# Patient Record
Sex: Male | Born: 1950 | Race: White | Hispanic: No | Marital: Single | Smoking: Never smoker
Health system: Southern US, Community
[De-identification: ages and names within clinical notes are randomized; demographics above are authoritative.]

## PROBLEM LIST (undated history)

## (undated) DIAGNOSIS — N138 Other obstructive and reflux uropathy: Secondary | ICD-10-CM

## (undated) DIAGNOSIS — N401 Enlarged prostate with lower urinary tract symptoms: Secondary | ICD-10-CM

## (undated) DIAGNOSIS — J309 Allergic rhinitis, unspecified: Secondary | ICD-10-CM

## (undated) HISTORY — DX: Benign prostatic hyperplasia with lower urinary tract symptoms: N40.1

## (undated) HISTORY — DX: Allergic rhinitis, unspecified: J30.9

## (undated) HISTORY — PX: COLONOSCOPY: SHX174

## (undated) HISTORY — DX: Other obstructive and reflux uropathy: N13.8

---

## 2013-09-15 ENCOUNTER — Encounter (INDEPENDENT_AMBULATORY_CARE_PROVIDER_SITE_OTHER): Payer: Self-pay | Admitting: Surgery

## 2013-09-24 ENCOUNTER — Ambulatory Visit (INDEPENDENT_AMBULATORY_CARE_PROVIDER_SITE_OTHER): Payer: BC Managed Care – PPO | Admitting: Surgery

## 2016-07-24 ENCOUNTER — Emergency Department (HOSPITAL_COMMUNITY)
Admission: EM | Admit: 2016-07-24 | Discharge: 2016-07-24 | Disposition: A | Discharge status: Home / Self Care | Payer: Medicare PPO | Attending: Emergency Medicine | Admitting: Emergency Medicine

## 2016-07-24 ENCOUNTER — Emergency Department (HOSPITAL_COMMUNITY): Payer: Medicare PPO

## 2016-07-24 ENCOUNTER — Encounter (HOSPITAL_COMMUNITY): Payer: Self-pay | Admitting: *Deleted

## 2016-07-24 DIAGNOSIS — S61325A Laceration with foreign body of left ring finger with damage to nail, initial encounter: Secondary | ICD-10-CM | POA: Diagnosis not present

## 2016-07-24 DIAGNOSIS — W268XXA Contact with other sharp object(s), not elsewhere classified, initial encounter: Secondary | ICD-10-CM | POA: Insufficient documentation

## 2016-07-24 DIAGNOSIS — S65515A Laceration of blood vessel of left ring finger, initial encounter: Secondary | ICD-10-CM | POA: Insufficient documentation

## 2016-07-24 DIAGNOSIS — Z23 Encounter for immunization: Secondary | ICD-10-CM | POA: Insufficient documentation

## 2016-07-24 DIAGNOSIS — Y929 Unspecified place or not applicable: Secondary | ICD-10-CM | POA: Insufficient documentation

## 2016-07-24 DIAGNOSIS — Y999 Unspecified external cause status: Secondary | ICD-10-CM | POA: Insufficient documentation

## 2016-07-24 DIAGNOSIS — S6992XA Unspecified injury of left wrist, hand and finger(s), initial encounter: Secondary | ICD-10-CM | POA: Diagnosis present

## 2016-07-24 DIAGNOSIS — Y9389 Activity, other specified: Secondary | ICD-10-CM | POA: Diagnosis not present

## 2016-07-24 DIAGNOSIS — Z79899 Other long term (current) drug therapy: Secondary | ICD-10-CM | POA: Diagnosis not present

## 2016-07-24 DIAGNOSIS — S65595A Other specified injury of blood vessel of left ring finger, initial encounter: Secondary | ICD-10-CM | POA: Diagnosis not present

## 2016-07-24 MED ORDER — LIDOCAINE HCL (PF) 1 % IJ SOLN
5.0000 mL | Freq: Once | INTRAMUSCULAR | Status: AC
  Administered 2016-07-24: 5 mL
  Filled 2016-07-24: qty 5

## 2016-07-24 MED ORDER — TETANUS-DIPHTH-ACELL PERTUSSIS 5-2.5-18.5 LF-MCG/0.5 IM SUSP
0.5000 mL | Freq: Once | INTRAMUSCULAR | Status: AC
  Administered 2016-07-24: 0.5 mL via INTRAMUSCULAR
  Filled 2016-07-24: qty 0.5

## 2016-07-24 NOTE — ED Triage Notes (Signed)
Pt reports cutting left ring finger with hedge clipper and has laceration to finger tip. Bleeding controlled and tetanus > 10years.

## 2016-07-24 NOTE — ED Provider Notes (Signed)
[facility]EM[facility]Y DEPT Provider Note    By signing my name below, I, Earmon PhoenixJennifer Waddell, attest that this documentation has been prepared under the direction and in the presence of [facility] Neese, [place]. Electronically Signed: Earmon PhoenixJennifer Waddell, ED Scribe. 07/24/16. 7:54 PM.   History   Chief Complaint Chief Complaint  Patient presents with  . Finger Injury    The history is provided by the patient. No language interpreter was used.    HPI Comments:  Manuel Oconnor is a 65 y.o. male who presents to the Emergency Department complaining of a laceration to the left fourth digit secondary to cutting it with hedge clippers earlier today. He reports associated bleeding that has since been controlled. [facility] in [place] and was told [facility]. He has not taken anything for pain. He denies modifying factors. He denies numbness, tingling or weakness of the left ring finger or left hand. Pt states his tetanus vaccination was about 35 years ago.  Past Medical History:  Diagnosis Date  . Allergic rhinitis, cause unspecified   . Hypertrophy of prostate with urinary obstruction and other lower urinary tract symptoms (LUTS)     There are no active problems to display for this patient.   Past Surgical History:  Procedure Laterality Date  . COLONOSCOPY         Home Medications    Prior to Admission medications   Medication Sig Start Date End Date Taking? Authorizing Provider  azithromycin (ZITHROMAX) 250 MG tablet Take by mouth daily.    Historical Provider, MD  cetirizine (ZYRTEC) 10 MG tablet Take 10 mg by mouth daily.    Historical Provider, MD  ibuprofen (ADVIL,MOTRIN) 200 MG tablet Take 200 mg by mouth every 6 (six) hours as needed.    Historical Provider, MD  meloxicam (MOBIC) 7.5 MG tablet Take 7.5 mg by mouth daily.    Historical Provider, MD  methocarbamol (ROBAXIN) 500 MG tablet Take 500 mg by mouth 4 (four) times daily.    Historical Provider, MD    pramipexole (MIRAPEX) 0.25 MG tablet Take 0.25 mg by mouth 3 (three) times daily.    Historical Provider, MD    Family History History reviewed. No pertinent family history.  Social History Social History  Substance Use Topics  . Smoking status: Never Smoker  . Smokeless tobacco: Not on file  . Alcohol use No     Allergies   Patient has no known allergies.   Review of Systems Review of Systems  Musculoskeletal: Positive for arthralgias.  Skin: Positive for wound.  Neurological: Negative for weakness and numbness.  All other systems reviewed and are negative.    Physical Exam Updated Vital Signs BP 169/90 (BP Location: Left Arm)   Pulse 76   Temp 97.5 F (36.4 C) (Oral)   Resp 18   SpO2 100%   Physical Exam  Constitutional: He is oriented to person, place, and time. He appears well-developed and well-nourished.  Eyes: EOM are normal.  Neck: Neck supple.  Cardiovascular:  Adequate circulation.  Pulmonary/Chest: Effort normal.  Abdominal: Soft. There is no tenderness.  Musculoskeletal: Normal range of motion.  V shaped laceration to palmar distal aspect of left fourth digit. Full ROM of digit. Strength normal.  Neurological: He is alert and oriented to person, place, and time. No cranial nerve deficit.  Good strength of left fourth finger.  Skin: Skin is warm and dry.  Nursing note and vitals reviewed.    ED  Treatments / Results  DIAGNOSTIC STUDIES: Oxygen Saturation is 100% on RA, normal by my interpretation.   COORDINATION OF CARE: 7:09 PM- Will suture wound and update tetanus vaccination. Pt verbalizes understanding and agrees to plan.  LACERATION REPAIR PROCEDURE NOTE The patient's identification was confirmed and consent was obtained. This procedure was performed by Kerrie BuffaloHope Neese, FNP at 7:34 PM. Site: distal left fourth digit Sterile procedures observed Anesthetic used (type and amt): Lidocaine 1% without Epinephrine (1 mLs) Suture type/size: 5-0  Prolene Length: 1.5 cm # of Sutures: 5 Technique: simple interrupted  Complexity: simple Antibx ointment applied Tetanus ordered Site anesthetized, irrigated with NS, explored without evidence of foreign body, wound well approximated, site covered with dry, sterile dressing.  Patient tolerated procedure well without complications. Instructions for care discussed verbally and patient provided with additional written instructions for homecare and f/u.  No tendon injury  Medications  lidocaine (PF) (XYLOCAINE) 1 % injection 5 mL (5 mLs Infiltration Given 07/24/16 1933)  Tdap (BOOSTRIX) injection 0.5 mL (0.5 mLs Intramuscular Given 07/24/16 1933)    Labs (all labs ordered are listed, but only abnormal results are displayed) Labs Reviewed - No data to display   Radiology No results found.  Procedures Procedures (including critical care time)  Medications Ordered in ED Medications  lidocaine (PF) (XYLOCAINE) 1 % injection 5 mL (5 mLs Infiltration Given 07/24/16 1933)  Tdap (BOOSTRIX) injection 0.5 mL (0.5 mLs Intramuscular Given 07/24/16 1933)     Initial Impression / Assessment and Plan / ED Course  I have reviewed the triage vital signs and the nursing notes.  Pertinent labs & imaging results that were available during my care of the patient were reviewed by me and considered in my medical decision making (see chart for details).  Clinical Course     Tetanus updated in ED. Laceration occurred < 12 hours prior to repair. Discussed laceration care with pt and answered questions. Pt to f-u for suture removal in 7 days and wound check sooner should there be signs of dehiscence or infection. Pt is hemodynamically stable with no complaints prior to dc.     I personally performed the services described in this documentation, which was scribed in my presence. The recorded information has been reviewed and is accurate.  Final Clinical Impressions(s) / ED Diagnoses   Final  diagnoses:  Laceration of blood vessel of left ring finger, initial encounter    New Prescriptions Discharge Medication List as of 07/24/2016  7:56 PM       Janne NapoleonHope M Neese, NP 07/27/16 0140    Melene Planan Floyd, DO 07/28/16 2027

## 2016-10-25 DIAGNOSIS — Z125 Encounter for screening for malignant neoplasm of prostate: Secondary | ICD-10-CM | POA: Diagnosis not present

## 2016-10-25 DIAGNOSIS — Z0000 Encounter for general adult medical examination without abnormal findings: Secondary | ICD-10-CM | POA: Diagnosis not present

## 2016-10-25 DIAGNOSIS — I10 Essential (primary) hypertension: Secondary | ICD-10-CM | POA: Diagnosis not present

## 2016-10-31 DIAGNOSIS — N401 Enlarged prostate with lower urinary tract symptoms: Secondary | ICD-10-CM | POA: Diagnosis not present

## 2016-10-31 DIAGNOSIS — J309 Allergic rhinitis, unspecified: Secondary | ICD-10-CM | POA: Diagnosis not present

## 2016-10-31 DIAGNOSIS — D075 Carcinoma in situ of prostate: Secondary | ICD-10-CM | POA: Diagnosis not present

## 2016-10-31 DIAGNOSIS — Z23 Encounter for immunization: Secondary | ICD-10-CM | POA: Diagnosis not present

## 2016-10-31 DIAGNOSIS — Z0001 Encounter for general adult medical examination with abnormal findings: Secondary | ICD-10-CM | POA: Diagnosis not present

## 2017-07-03 ENCOUNTER — Ambulatory Visit: Payer: Medicare PPO | Admitting: Physician Assistant

## 2017-07-03 ENCOUNTER — Encounter: Payer: Self-pay | Admitting: Physician Assistant

## 2017-07-03 VITALS — BP 128/86 | HR 67 | Temp 97.8°F | Resp 18 | Ht 69.0 in | Wt 203.4 lb

## 2017-07-03 DIAGNOSIS — J019 Acute sinusitis, unspecified: Secondary | ICD-10-CM | POA: Diagnosis not present

## 2017-07-03 MED ORDER — GUAIFENESIN ER 1200 MG PO TB12: 1.0000 | ORAL_TABLET | Freq: Two times a day (BID) | ORAL | 1 refills | Status: AC | PRN

## 2017-07-03 MED ORDER — AMOXICILLIN-POT CLAVULANATE 875-125 MG PO TABS: 1.0000 | ORAL_TABLET | Freq: Two times a day (BID) | ORAL | 0 refills | Status: AC

## 2017-07-03 MED ORDER — AZELASTINE HCL 0.1 % NA SOLN: 2.0000 | Freq: Two times a day (BID) | NASAL | 0 refills | Status: AC

## 2017-07-03 MED ORDER — BENZONATATE 100 MG PO CAPS: 100.0000 mg | ORAL_CAPSULE | Freq: Three times a day (TID) | ORAL | 0 refills | Status: AC | PRN

## 2017-07-03 NOTE — Patient Instructions (Addendum)
Get plenty of rest and drink at least 64 ounces of water daily.   IF you received an x-ray today, you will receive an invoice from [facility]. Please contact [facility] at [phone] with questions or concerns regarding your invoice.   IF you received labwork today, you will receive an invoice from [facility]. Please contact [facility] at [phone] with questions or concerns regarding your invoice.   Our billing staff will not be able to assist you with questions regarding bills from these companies.  You will be contacted with the lab results as soon as they are available. The fastest way to get your results is to activate your My Chart account. Instructions are located on the last page of this paperwork. If you have not heard from us regarding the results in 2 weeks, please contact this office.    Antibiotics Aren't Always The Answer  CDC Urges Public To Be Antibiotics Aware The Centers for Disease Control and Prevention (CDC) encourages patients and families to Be Antibiotics Aware by learning about safe antibiotic use. Each year in the [place], at least 2 million people get infected with antibiotic-resistant bacteria. At least 23,000 die as a result. Antibiotic resistance, one of the most urgent threats to the public's health, occurs when bacteria develop the ability to defeat the drugs designed to kill them.  What Do Antibiotics Treat? When you get a prescription for antibiotics, follow your doctor's instructions carefully.  Antibiotics are critical tools for treating a number of common infections, such as pneumonia, and for life-threatening conditions including sepsis. Antibiotics are only needed for treating certain infections caused by bacteria.  What Don't Antibiotics Treat? Antibiotics do not work on viruses, such as colds and flu, or runny noses, even if the mucus is thick, yellow or green. Antibiotics also won't help some common bacterial infections  including most cases of bronchitis, many sinus infections, and some ear infections.  What Are The Side Effects of Antibiotics? Any time antibiotics are used, they can cause side effects and lead to antibiotic resistance. When antibiotics aren't needed, they won't help you, and the side effects could still hurt you. Common side effects range from things like rashes and yeast infections to severe health problems. More serious side effects include Clostridium difficile infection (also called C. difficile or C. diff), which causes diarrhea that can lead to severe colon damage and death. If you need antibiotics, take them exactly as prescribed. Patients and families can talk to their healthcare professional if they have any questions about their antibiotics, or if they develop side effects, especially diarrhea, since that could be C. difficile, which needs to be treated.  Can I Feel Better Without Antibiotics? Patients and families can ask their healthcare professional about the best way to feel better while their body fights off the virus. Respiratory viruses usually go away in a week or two without treatment.  How Can I Stay Healthy? Regular hand-washing can go a long way toward protecting you from germs.  We can all stay healthy and keep others healthy by cleaning our hands, covering our coughs, staying home when sick, and getting recommended vaccines, for the flu, for example. Antibiotics save lives. When a patient needs antibiotics, the benefits outweigh the risks of side effects and antibiotic resistance. Improving the way we take antibiotics helps keep us healthy now, helps fight antibiotic resistance, and ensures that life-saving antibiotics will be available for future generations.  To learn more about antibiotic prescribing and use, visit www.cdc.gov/antibiotic-use.   

## 2017-07-03 NOTE — Progress Notes (Signed)
Subjective:    Patient ID: Manuel Oconnor, male    DOB: 12/07/1950, 66 y.o.   MRN: [id number]  HPI  Mr. Manuel MorasOwen is a 66 year old Caucasian male with no past medical history pertinent to today's visit who presents today with nasal congestion and sinus pressure. Mr. Manuel MorasOwen states his symptoms started 8 days ago. Patient states he also had sinus pain and headaches.  He is taking Vitamin C daily to try to keep his symptoms away. Patient states he started taking Sudafed for Sinus Pain and Pressure 2 days ago which he states helps improve his symptoms. He is also taking Mucinex which seems to help.   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 He states that he voice sounds a little deeper than usual. Mr. Manuel MorasOwen reports he was recently around his grandchildren who had viral upper respiratory infections.   Review of Systems  Constitutional: Positive for fatigue (Reports it is not affecting his daily activities ). Negative for activity change, appetite change and fever.  HENT: Positive for congestion, postnasal drip, rhinorrhea, sinus pressure, sinus pain, sneezing (Occasional) and voice change. Negative for ear discharge, ear pain, facial swelling and sore throat.   Eyes: Positive for discharge. Negative for pain and redness.  Respiratory: Positive for cough (Usually dry, occasionally productive). Negative for chest tightness, shortness of breath and wheezing.   Gastrointestinal: Negative for nausea and vomiting.  Neurological: Positive for headaches.    Medications:  Prior to Admission medications   Medication Sig Start Date End Date Taking? Authorizing Provider  cetirizine (ZYRTEC) 10 MG tablet Take 10 mg by mouth daily.   Yes [provider]  azithromycin (ZITHROMAX) 250 MG tablet Take by mouth daily.    [provider]  ibuprofen (ADVIL,MOTRIN) 200 MG tablet Take 200 mg by mouth every 6 (six) hours as needed.    [provider]  meloxicam (MOBIC) 7.5 MG tablet Take  7.5 mg by mouth daily.    [provider]  methocarbamol (ROBAXIN) 500 MG tablet Take 500 mg by mouth 4 (four) times daily.    [provider]  pramipexole (MIRAPEX) 0.25 MG tablet Take 0.25 mg by mouth 3 (three) times daily.    [provider]   Allergies:  No Known Allergies  Chronic Medical Conditions:  There are no active problems to display for this patient.     Objective:   Physical Exam  Constitutional: He appears well-developed and well-nourished. He is active and cooperative. No distress.  BP 128/86 (BP Location: Left Arm, Patient Position: Sitting, Cuff Size: Normal)   Pulse 67   Temp 97.8 F (36.6 C) (Oral)   Resp 18   Ht 5\' 9"  (1.753 m)   Wt 203 lb 6.4 oz (92.3 kg)   SpO2 97%   BMI 30.04 kg/m    HENT:  Head: Normocephalic and atraumatic.  Right Ear: External ear normal. No tenderness. Tympanic membrane is not erythematous and not bulging. No middle ear effusion.  Left Ear: External ear normal. No tenderness. Tympanic membrane is not erythematous and not bulging.  No middle ear effusion.  Ears:  Nose: No rhinorrhea, sinus tenderness or nasal deformity. Right sinus exhibits maxillary sinus tenderness and frontal sinus tenderness. Left sinus exhibits maxillary sinus tenderness and frontal sinus tenderness.    Mouth/Throat: Uvula is midline and oropharynx is clear and moist. No oral lesions. Normal dentition. No oropharyngeal exudate or posterior oropharyngeal erythema.  Eyes: Conjunctivae are normal. Pupils are  equal, round, and reactive to light. Right eye exhibits no discharge. Left eye exhibits no discharge.  Neck: Normal range of motion. Neck supple. No thyromegaly present.  Cardiovascular: Normal rate, regular rhythm, S1 normal, S2 normal, normal heart sounds and intact distal pulses.  No murmur heard. Pulses:      Radial pulses are 2+ on the right side, and 2+ on the left side.  Pulmonary/Chest: Effort normal and breath sounds normal.  He has no wheezes.  Lymphadenopathy:    He has no cervical adenopathy.  Neurological: He is alert.  Skin: Skin is warm and dry.      Assessment & Plan:  1. Acute non-recurrent sinusitis, unspecified location - Begin Benzonatate 100-200mg  TID as needed for cough  - Begin Azelastine 0.1% nasal spray 2 sprays in both nostrils daily.  - Begin Mucinex Maximum Strength 1200mg  every 12 hours as needed  - Printed prescription for Augmentin 875-124mg  BID x 10 days for patient to fill at the pharmacy if symptoms do not resolve in the next 48 hours  - Instructed patient to continue to get plenty of rest and stay hydrated by drinking at least 64 oz of water per day. - Patient to return to clinic if symptoms worsen or do not improve if he starts the Augmentin.  Judie Petitaylin Pegge Cumberledge, PA-S

## 2017-07-03 NOTE — Progress Notes (Signed)
Patient ID: Manuel Oconnor, male     DOB: 12/05/1950, 66 y.o.    MRN: [id number]  PCP: Patient, No Pcp Per  Chief Complaint  Patient presents with  . Sinus Problem    x1 week, pt states he has a dull headache, slight cough, and pressure in the face. Pt states he has been taking OTC Sudafed Sinus Pressure and Pain.    Subjective:   This patient is new to this practice and presents for evaluation of 1 week of sinus pressure and nasal congestion.  Associated symptoms include headache, mild cough. OTC products have helped. 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  Review of Systems Constitutional: Positive for fatigue (Reports it is not affecting his daily activities ). Negative for activity change, appetite change and fever.  HENT: Positive for congestion, postnasal drip, rhinorrhea, sinus pressure, sinus pain, sneezing (Occasional) and voice change. Negative for ear discharge, ear pain, facial swelling and sore throat.   Eyes: Positive for discharge. Negative for pain and redness.  Respiratory: Positive for cough (Usually dry, occasionally productive). Negative for chest tightness, shortness of breath and wheezing.   Gastrointestinal: Negative for nausea and vomiting.  Neurological: Positive for headaches.     Prior to Admission medications   Medication Sig Start Date End Date Taking? Authorizing Provider  cetirizine (ZYRTEC) 10 MG tablet Take 10 mg by mouth daily.   Yes [provider]     No Known Allergies   There are no active problems to display for this patient.    History reviewed. No pertinent family history.   Social History   Socioeconomic History  . Marital status: Single    Spouse name: Not on file  . Number of children: Not on file  . Years of education: Not on file  . Highest education level: Not on file  Social Needs  .  Financial resource strain: Not on file  . Food insecurity - worry: Not on file  . Food insecurity - inability: Not on file  . Transportation needs - medical: Not on file  . Transportation needs - non-medical: Not on file  Occupational History  . Not on file  Tobacco Use  . Smoking status: Never Smoker  . Smokeless tobacco: Never Used  Substance and Sexual Activity  . Alcohol use: No  . Drug use: No  . Sexual activity: Not on file  Other Topics Concern  . Not on file  Social History Narrative  . Not on file         Objective:  Physical Exam  Constitutional: He is oriented to person, place, and time. He appears well-developed and well-nourished. No distress.  BP 128/86 (BP Location: Left Arm, Patient Position: Sitting, Cuff Size: Normal)   Pulse 67   Temp 97.8 F (36.6 C) (Oral)   Resp 18   Ht 5\' 9"  (1.753 m)   Wt 203 lb 6.4 oz (92.3 kg)   SpO2 97%   BMI 30.04 kg/m    HENT:  Head: Normocephalic and atraumatic.  Right Ear: Hearing, external ear and ear canal normal. No mastoid tenderness. Tympanic membrane is not injected, not scarred, not perforated, not erythematous, not retracted and not bulging. A middle ear effusion is present. No hemotympanum.  Left Ear: Hearing, external ear and ear canal normal. No mastoid tenderness. Tympanic membrane is not injected, not scarred, not perforated, not  erythematous, not retracted and not bulging. A middle ear effusion is present. No hemotympanum.  Nose: Mucosal edema and rhinorrhea present.  No foreign bodies. Right sinus exhibits maxillary sinus tenderness and frontal sinus tenderness. Left sinus exhibits maxillary sinus tenderness and frontal sinus tenderness.  Mouth/Throat: Uvula is midline, oropharynx is clear and moist and mucous membranes are normal. No uvula swelling. No oropharyngeal exudate.  Eyes: Conjunctivae and EOM are normal. Pupils are equal, round, and reactive to light. Right eye exhibits no discharge. Left eye exhibits  no discharge. No scleral icterus.  Neck: Trachea normal, normal range of motion and full passive range of motion without pain. Neck supple. No thyroid mass and no thyromegaly present.  Cardiovascular: Normal rate, regular rhythm and normal heart sounds.  Pulmonary/Chest: Effort normal and breath sounds normal.  Lymphadenopathy:       Head (right side): No submandibular, no tonsillar, no preauricular, no posterior auricular and no occipital adenopathy present.       Head (left side): No submandibular, no tonsillar, no preauricular and no occipital adenopathy present.    He has no cervical adenopathy.       Right: No supraclavicular adenopathy present.       Left: No supraclavicular adenopathy present.  Neurological: He is alert and oriented to person, place, and time. He has normal strength. No cranial nerve deficit or sensory deficit.  Skin: Skin is warm, dry and intact. No rash noted.  Psychiatric: He has a normal mood and affect. His speech is normal and behavior is normal.             Assessment & Plan:  1. Acute non-recurrent sinusitis, unspecified location Supportive care.  Anticipatory guidance.  RTC if symptoms worsen/persist. - benzonatate (TESSALON) 100 MG capsule; Take 1-2 capsules (100-200 mg total) 3 (three) times daily as needed by mouth for cough.  Dispense: 40 capsule; Refill: 0 - azelastine (ASTELIN) 0.1 % nasal spray; Place 2 sprays 2 (two) times daily into both nostrils. Use in each nostril as directed  Dispense: 30 mL; Refill: 0 - Guaifenesin (MUCINEX MAXIMUM STRENGTH) 1200 MG TB12; Take 1 tablet (1,200 mg total) every 12 (twelve) hours as needed by mouth.  Dispense: 14 tablet; Refill: 1 - amoxicillin-clavulanate (AUGMENTIN) 875-125 MG tablet; Take 1 tablet 2 (two) times daily for 10 days by mouth.  Dispense: 20 tablet; Refill: 0    Return if symptoms worsen or fail to improve.   Fernande Brashelle S. Achol Azpeitia, PA-C [facility] at [facility]

## 2017-11-03 DIAGNOSIS — I10 Essential (primary) hypertension: Secondary | ICD-10-CM | POA: Diagnosis not present

## 2017-11-03 DIAGNOSIS — N390 Urinary tract infection, site not specified: Secondary | ICD-10-CM | POA: Diagnosis not present

## 2017-11-03 DIAGNOSIS — Z125 Encounter for screening for malignant neoplasm of prostate: Secondary | ICD-10-CM | POA: Diagnosis not present

## 2017-11-07 DIAGNOSIS — Z86010 Personal history of colonic polyps: Secondary | ICD-10-CM | POA: Diagnosis not present

## 2017-11-07 DIAGNOSIS — J309 Allergic rhinitis, unspecified: Secondary | ICD-10-CM | POA: Diagnosis not present

## 2017-11-07 DIAGNOSIS — D075 Carcinoma in situ of prostate: Secondary | ICD-10-CM | POA: Diagnosis not present

## 2017-11-07 DIAGNOSIS — E739 Lactose intolerance, unspecified: Secondary | ICD-10-CM | POA: Diagnosis not present

## 2017-11-07 DIAGNOSIS — N401 Enlarged prostate with lower urinary tract symptoms: Secondary | ICD-10-CM | POA: Diagnosis not present

## 2017-11-07 DIAGNOSIS — M545 Low back pain: Secondary | ICD-10-CM | POA: Diagnosis not present

## 2017-11-07 DIAGNOSIS — G2581 Restless legs syndrome: Secondary | ICD-10-CM | POA: Diagnosis not present

## 2017-11-07 DIAGNOSIS — Z0001 Encounter for general adult medical examination with abnormal findings: Secondary | ICD-10-CM | POA: Diagnosis not present

## 2017-11-07 DIAGNOSIS — Z23 Encounter for immunization: Secondary | ICD-10-CM | POA: Diagnosis not present

## 2017-11-07 DIAGNOSIS — I10 Essential (primary) hypertension: Secondary | ICD-10-CM | POA: Diagnosis not present

## 2018-01-25 DIAGNOSIS — J019 Acute sinusitis, unspecified: Secondary | ICD-10-CM | POA: Diagnosis not present

## 2018-01-25 DIAGNOSIS — J209 Acute bronchitis, unspecified: Secondary | ICD-10-CM | POA: Diagnosis not present

## 2018-02-17 DIAGNOSIS — J019 Acute sinusitis, unspecified: Secondary | ICD-10-CM | POA: Diagnosis not present

## 2018-04-22 DIAGNOSIS — H903 Sensorineural hearing loss, bilateral: Secondary | ICD-10-CM | POA: Diagnosis not present

## 2018-04-22 DIAGNOSIS — H6983 Other specified disorders of Eustachian tube, bilateral: Secondary | ICD-10-CM | POA: Diagnosis not present

## 2018-08-11 DIAGNOSIS — Z23 Encounter for immunization: Secondary | ICD-10-CM | POA: Diagnosis not present

## 2018-08-11 DIAGNOSIS — J0100 Acute maxillary sinusitis, unspecified: Secondary | ICD-10-CM | POA: Diagnosis not present

## 2018-11-06 DIAGNOSIS — I10 Essential (primary) hypertension: Secondary | ICD-10-CM | POA: Diagnosis not present

## 2018-11-06 DIAGNOSIS — Z125 Encounter for screening for malignant neoplasm of prostate: Secondary | ICD-10-CM | POA: Diagnosis not present

## 2018-11-06 DIAGNOSIS — Z1159 Encounter for screening for other viral diseases: Secondary | ICD-10-CM | POA: Diagnosis not present

## 2018-11-11 DIAGNOSIS — Z6830 Body mass index (BMI) 30.0-30.9, adult: Secondary | ICD-10-CM | POA: Diagnosis not present

## 2018-11-11 DIAGNOSIS — Z1212 Encounter for screening for malignant neoplasm of rectum: Secondary | ICD-10-CM | POA: Diagnosis not present

## 2018-11-11 DIAGNOSIS — J309 Allergic rhinitis, unspecified: Secondary | ICD-10-CM | POA: Diagnosis not present

## 2018-11-11 DIAGNOSIS — Z0001 Encounter for general adult medical examination with abnormal findings: Secondary | ICD-10-CM | POA: Diagnosis not present

## 2018-11-11 DIAGNOSIS — N401 Enlarged prostate with lower urinary tract symptoms: Secondary | ICD-10-CM | POA: Diagnosis not present

## 2018-12-15 ENCOUNTER — Other Ambulatory Visit: Payer: Self-pay | Admitting: Urology

## 2018-12-15 DIAGNOSIS — N4231 Prostatic intraepithelial neoplasia: Secondary | ICD-10-CM | POA: Diagnosis not present

## 2018-12-15 DIAGNOSIS — N402 Nodular prostate without lower urinary tract symptoms: Secondary | ICD-10-CM | POA: Diagnosis not present

## 2018-12-15 DIAGNOSIS — R972 Elevated prostate specific antigen [PSA]: Secondary | ICD-10-CM | POA: Diagnosis not present

## 2018-12-15 DIAGNOSIS — N4232 Atypical small acinar proliferation of prostate: Secondary | ICD-10-CM | POA: Diagnosis not present

## 2018-12-30 ENCOUNTER — Other Ambulatory Visit: Payer: Self-pay

## 2018-12-30 ENCOUNTER — Ambulatory Visit
Admission: RE | Admit: 2018-12-30 | Discharge: 2018-12-30 | Disposition: A | Discharge status: Home / Self Care | Payer: Medicare PPO | Source: Ambulatory Visit | Attending: Urology | Admitting: Urology

## 2018-12-30 DIAGNOSIS — R972 Elevated prostate specific antigen [PSA]: Secondary | ICD-10-CM | POA: Diagnosis not present

## 2018-12-30 MED ORDER — GADOBENATE DIMEGLUMINE 529 MG/ML IV SOLN
20.0000 mL | Freq: Once | INTRAVENOUS | Status: AC | PRN
  Administered 2018-12-30: 20 mL via INTRAVENOUS

## 2019-01-05 DIAGNOSIS — N402 Nodular prostate without lower urinary tract symptoms: Secondary | ICD-10-CM | POA: Diagnosis not present

## 2019-01-05 DIAGNOSIS — R972 Elevated prostate specific antigen [PSA]: Secondary | ICD-10-CM | POA: Diagnosis not present

## 2019-01-13 DIAGNOSIS — N411 Chronic prostatitis: Secondary | ICD-10-CM | POA: Diagnosis not present

## 2019-01-13 DIAGNOSIS — R972 Elevated prostate specific antigen [PSA]: Secondary | ICD-10-CM | POA: Diagnosis not present

## 2019-02-04 DIAGNOSIS — R972 Elevated prostate specific antigen [PSA]: Secondary | ICD-10-CM | POA: Diagnosis not present

## 2019-02-04 DIAGNOSIS — N400 Enlarged prostate without lower urinary tract symptoms: Secondary | ICD-10-CM | POA: Diagnosis not present

## 2019-07-29 IMAGING — MR MR PROSTATE WO/W CM
56 series · 56 of 56 positions shown · IV contrast (multihance)
Comparison: None.

CLINICAL DATA: Elevated PSA. PSA equal 9.2 on 11/06/2018. Negative
biopsy 10/29/2012.

EXAM:
MR PROSTATE WITHOUT AND WITH CONTRAST
TECHNIQUE: Multiplanar multisequence MRI images were obtained of the pelvis
centered about the prostate. Pre and post contrast images were
obtained.
CONTRAST:  20mL MULTIHANCE GADOBENATE DIMEGLUMINE 529 MG/ML IV SOLN

[Series 2: new loc · axial · 6.0mm · 0.88mm/px · 1 of 37 slices shown]
[im 1/37]
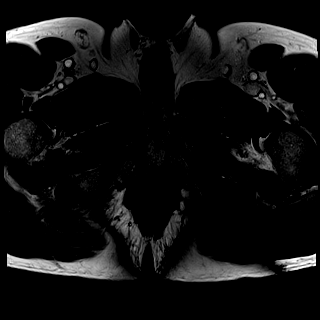

[Series 3: T1 · axial · 8.0mm · 1.06mm/px · 1 of 28 slices shown (1 of 2)]
[im 1/28]
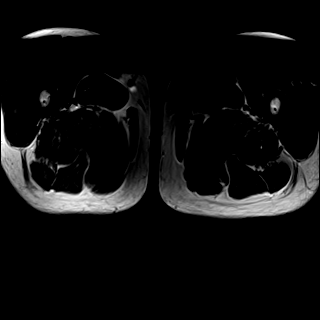

[Series 4: bSSFP fat-sat · axial · 8.0mm · 0.74mm/px · 1 of 28 slices shown]
[im 1/28]
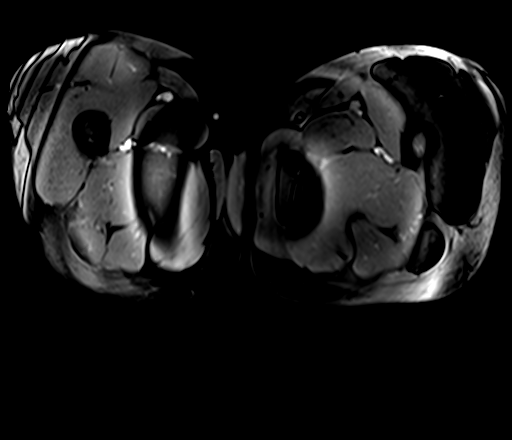

[Series 5: T2 · sagittal · 3.5mm · 0.56mm/px · 1 of 39 slices shown (1 of 4)]
[im 1/39]
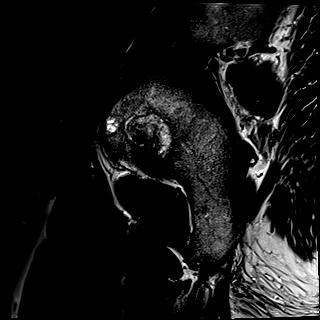

[Series 6: T2 · coronal · 3.5mm · 0.56mm/px · 1 of 23 slices shown (2 of 4)]
[im 1/23]
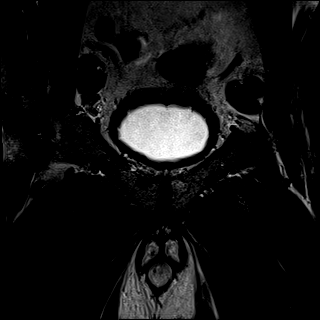

[Series 7: T1 · axial · 3.0mm · 0.31mm/px · 1 of 24 slices shown (2 of 2)]
[im 1/24]
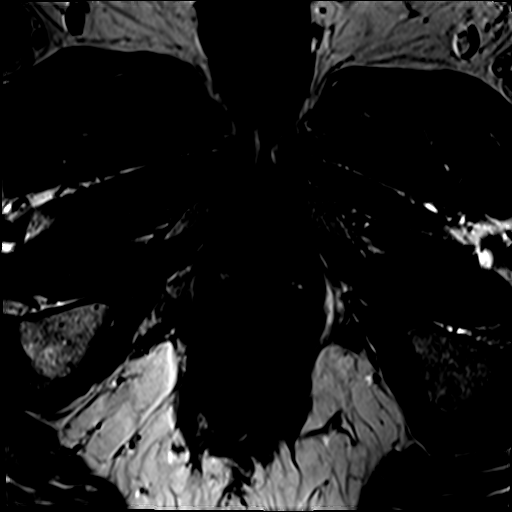

[Series 8: T2 · axial · 3.5mm · 0.56mm/px · 1 of 23 slices shown (3 of 4)]
[im 1/23]
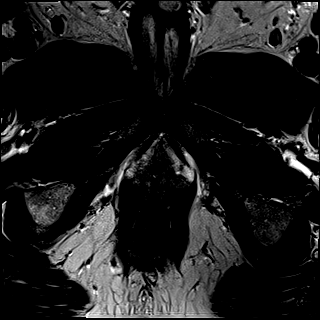

[Series 9: T2 · axial · 1.0mm · 1.04mm/px · 1 of 80 slices shown (4 of 4)]
[im 1/80]
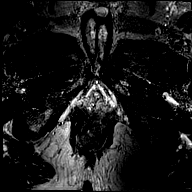

[Series 10: DWI · axial · 3.5mm · 1.56mm/px · 1 of 64 slices shown (1 of 2)]
[im 1/64]
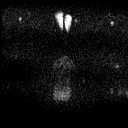

[Series 11: DWI · axial · 3.5mm · 1.56mm/px · 1 of 21 slices shown (2 of 2)]
[im 1/21]
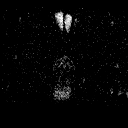

[Series 12: pre t1_twist_tra_dyn_ttc=5.6s · axial · non-contrast · 3.5mm · 0.83mm/px · 1 of 22 slices shown]
[im 1/22]
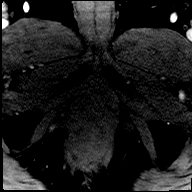

[Series 13: post t1_twist_tra_dyn-copy center · axial · 3.5mm · 0.83mm/px · 1 of 22 slices shown (1 of 23)]
[im 1/22]
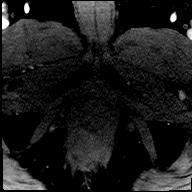

[Series 14: post t1_twist_tra_dyn-copy center · axial · 3.5mm · 0.83mm/px · 1 of 22 slices shown (2 of 23)]
[im 1/22]
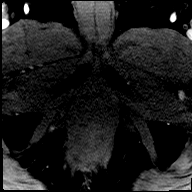

[Series 15: post t1_twist_tra_dyn-copy cent_sub_ttc=(id) · axial · 3.5mm · 0.83mm/px · 1 of 22 slices shown (1 of 22)]
[im 1/22]
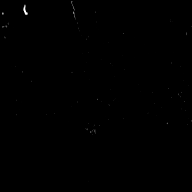

[Series 16: post t1_twist_tra_dyn-copy center · axial · 3.5mm · 0.83mm/px · 1 of 22 slices shown (3 of 23)]
[im 1/22]
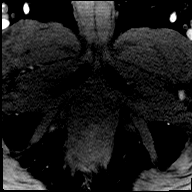

[Series 17: post t1_twist_tra_dyn-copy cent_sub_ttc=(id) · axial · 3.5mm · 0.83mm/px · 1 of 22 slices shown (2 of 22)]
[im 1/22]
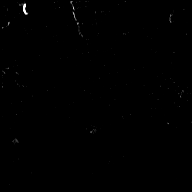

[Series 18: post t1_twist_tra_dyn-copy center · axial · 3.5mm · 0.83mm/px · 1 of 22 slices shown (4 of 23)]
[im 1/22]
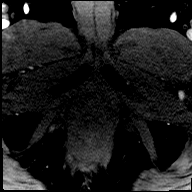

[Series 19: post t1_twist_tra_dyn-copy cent_sub_ttc=(id) · axial · 3.5mm · 0.83mm/px · 1 of 22 slices shown (3 of 22)]
[im 1/22]
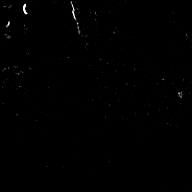

[Series 20: post t1_twist_tra_dyn-copy center · axial · 3.5mm · 0.83mm/px · 1 of 22 slices shown (5 of 23)]
[im 1/22]
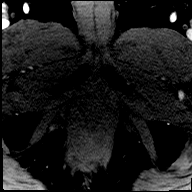

[Series 21: post t1_twist_tra_dyn-copy cent_sub_ttc=(id) · axial · 3.5mm · 0.83mm/px · 1 of 22 slices shown (4 of 22)]
[im 1/22]
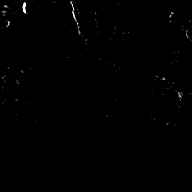

[Series 22: post t1_twist_tra_dyn-copy center · axial · 3.5mm · 0.83mm/px · 1 of 22 slices shown (6 of 23)]
[im 1/22]
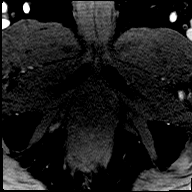

[Series 23: post t1_twist_tra_dyn-copy cent_sub_ttc=(id) · axial · 3.5mm · 0.83mm/px · 1 of 22 slices shown (5 of 22)]
[im 1/22]
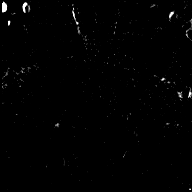

[Series 24: post t1_twist_tra_dyn-copy center · axial · 3.5mm · 0.83mm/px · 1 of 22 slices shown (7 of 23)]
[im 1/22]
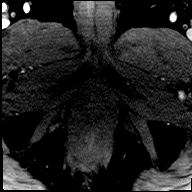

[Series 25: post t1_twist_tra_dyn-copy cent_sub_ttc=(id) · axial · 3.5mm · 0.83mm/px · 1 of 22 slices shown (6 of 22)]
[im 1/22]
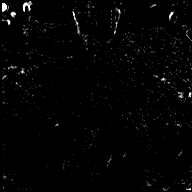

[Series 26: post t1_twist_tra_dyn-copy center · axial · 3.5mm · 0.83mm/px · 1 of 22 slices shown (8 of 23)]
[im 1/22]
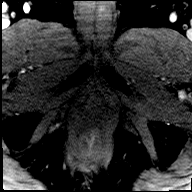

[Series 27: post t1_twist_tra_dyn-copy cent_sub_ttc=(id) · axial · 3.5mm · 0.83mm/px · 1 of 22 slices shown (7 of 22)]
[im 1/22]
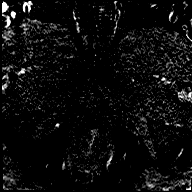

[Series 28: post t1_twist_tra_dyn-copy center · axial · 3.5mm · 0.83mm/px · 1 of 22 slices shown (9 of 23)]
[im 1/22]
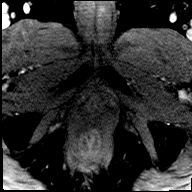

[Series 29: post t1_twist_tra_dyn-copy cent_sub_ttc=(id) · axial · 3.5mm · 0.83mm/px · 1 of 22 slices shown (8 of 22)]
[im 1/22]
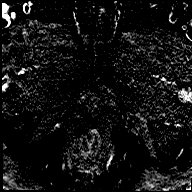

[Series 30: post t1_twist_tra_dyn-copy center · axial · 3.5mm · 0.83mm/px · 1 of 22 slices shown (10 of 23)]
[im 1/22]
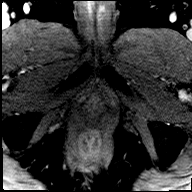

[Series 31: post t1_twist_tra_dyn-copy cent_sub_ttc=(id) · axial · 3.5mm · 0.83mm/px · 1 of 22 slices shown (9 of 22)]
[im 1/22]
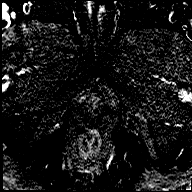

[Series 32: post t1_twist_tra_dyn-copy center · axial · 3.5mm · 0.83mm/px · 1 of 22 slices shown (11 of 23)]
[im 1/22]
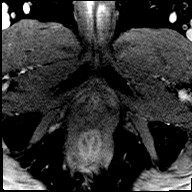

[Series 33: post t1_twist_tra_dyn-copy cent_sub_ttc=(id) · axial · 3.5mm · 0.83mm/px · 1 of 22 slices shown (10 of 22)]
[im 1/22]
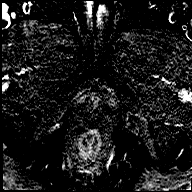

[Series 34: post t1_twist_tra_dyn-copy center · axial · 3.5mm · 0.83mm/px · 1 of 22 slices shown (12 of 23)]
[im 1/22]
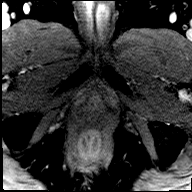

[Series 35: post t1_twist_tra_dyn-copy cent_sub_ttc=(id) · axial · 3.5mm · 0.83mm/px · 1 of 22 slices shown (11 of 22)]
[im 1/22]
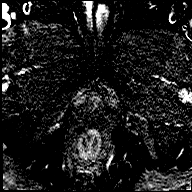

[Series 36: post t1_twist_tra_dyn-copy center · axial · 3.5mm · 0.83mm/px · 1 of 22 slices shown (13 of 23)]
[im 1/22]
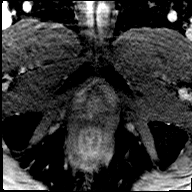

[Series 37: post t1_twist_tra_dyn-copy cent_sub_ttc=(id) · axial · 3.5mm · 0.83mm/px · 1 of 22 slices shown (12 of 22)]
[im 1/22]
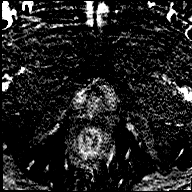

[Series 38: post t1_twist_tra_dyn-copy center · axial · 3.5mm · 0.83mm/px · 1 of 22 slices shown (14 of 23)]
[im 1/22]
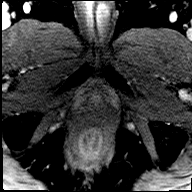

[Series 39: post t1_twist_tra_dyn-copy cent_sub_ttc=(id) · axial · 3.5mm · 0.83mm/px · 1 of 22 slices shown (13 of 22)]
[im 1/22]
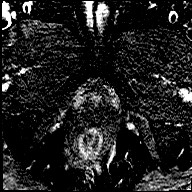

[Series 40: post t1_twist_tra_dyn-copy center · axial · 3.5mm · 0.83mm/px · 1 of 22 slices shown (15 of 23)]
[im 1/22]
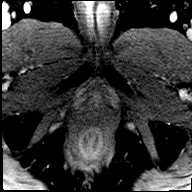

[Series 41: post t1_twist_tra_dyn-copy cent_sub_ttc=(id) · axial · 3.5mm · 0.83mm/px · 1 of 22 slices shown (14 of 22)]
[im 1/22]
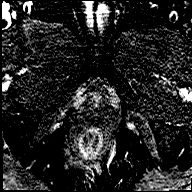

[Series 42: post t1_twist_tra_dyn-copy center · axial · 3.5mm · 0.83mm/px · 1 of 22 slices shown (16 of 23)]
[im 1/22]
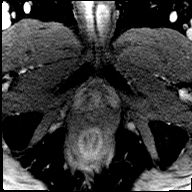

[Series 43: post t1_twist_tra_dyn-copy cent_sub_ttc=(id) · axial · 3.5mm · 0.83mm/px · 1 of 22 slices shown (15 of 22)]
[im 1/22]
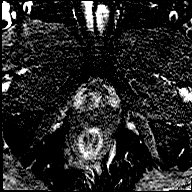

[Series 44: post t1_twist_tra_dyn-copy center · axial · 3.5mm · 0.83mm/px · 1 of 22 slices shown (17 of 23)]
[im 1/22]
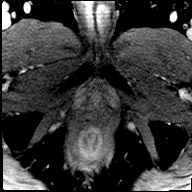

[Series 45: post t1_twist_tra_dyn-copy cent_sub_ttc=(id) · axial · 3.5mm · 0.83mm/px · 1 of 22 slices shown (16 of 22)]
[im 1/22]
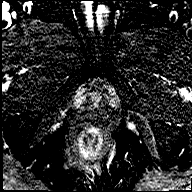

[Series 46: post t1_twist_tra_dyn-copy center · axial · 3.5mm · 0.83mm/px · 1 of 22 slices shown (18 of 23)]
[im 1/22]
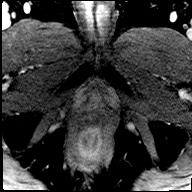

[Series 47: post t1_twist_tra_dyn-copy cent_sub_ttc=(id) · axial · 3.5mm · 0.83mm/px · 1 of 22 slices shown (17 of 22)]
[im 1/22]
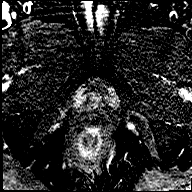

[Series 48: post t1_twist_tra_dyn-copy center · axial · 3.5mm · 0.83mm/px · 1 of 22 slices shown (19 of 23)]
[im 1/22]
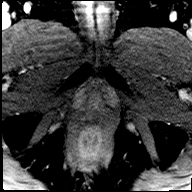

[Series 49: post t1_twist_tra_dyn-copy cent_sub_ttc=(id) · axial · 3.5mm · 0.83mm/px · 1 of 22 slices shown (18 of 22)]
[im 1/22]
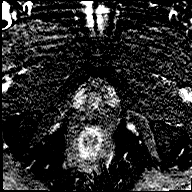

[Series 50: post t1_twist_tra_dyn-copy center · axial · 3.5mm · 0.83mm/px · 1 of 22 slices shown (20 of 23)]
[im 1/22]
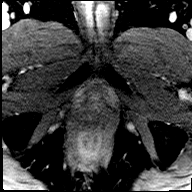

[Series 51: post t1_twist_tra_dyn-copy cent_sub_ttc=(id) · axial · 3.5mm · 0.83mm/px · 1 of 22 slices shown (19 of 22)]
[im 1/22]
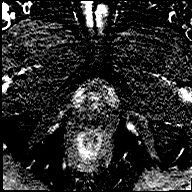

[Series 52: post t1_twist_tra_dyn-copy center · axial · 3.5mm · 0.83mm/px · 1 of 22 slices shown (21 of 23)]
[im 1/22]
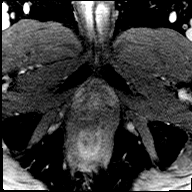

[Series 53: post t1_twist_tra_dyn-copy cent_sub_ttc=(id) · axial · 3.5mm · 0.83mm/px · 1 of 22 slices shown (20 of 22)]
[im 1/22]
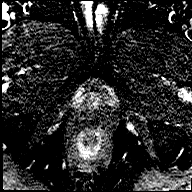

[Series 54: post t1_twist_tra_dyn-copy center · axial · 3.5mm · 0.83mm/px · 1 of 22 slices shown (22 of 23)]
[im 1/22]
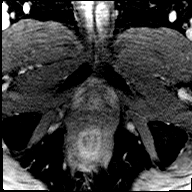

[Series 55: post t1_twist_tra_dyn-copy cent_sub_ttc=(id) · axial · 3.5mm · 0.83mm/px · 1 of 22 slices shown (21 of 22)]
[im 1/22]
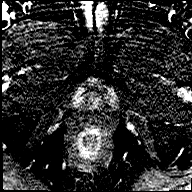

[Series 56: post t1_twist_tra_dyn-copy center · axial · 3.5mm · 0.83mm/px · 1 of 22 slices shown (23 of 23)]
[im 1/22]
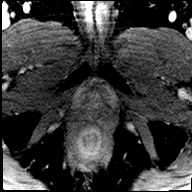

[Series 57: post t1_twist_tra_dyn-copy cent_sub_ttc=(id) · axial · 3.5mm · 0.83mm/px · 1 of 22 slices shown (22 of 22)]
[im 1/22]
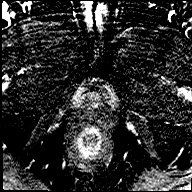

[56 of 56 positions shown; findings below may reference images not displayed]

FINDINGS: Prostate: The peripheral zone is thinned by an enlarged transitional
zone. There is heterogeneous signal intensity within the peripheral
zone on T2 weighted imaging. One focus of low signal intensity is
present in the LEFT base measuring 8 mm (image [DATE]). Difficult to
tell if this signal abnormality is within the peripheral zone or
transitional zone. There is mild restricted diffusion at this site
on image 13, series 10 and enhancement through this region.

The transitional zone is enlarged with multiple capsulated nodules.
No suspicious lesion on T2 weighted imaging.

Prostatic capsule intact.  Seminal vesicles are normal.

Volume: 5.3 x 4.3 by 6.0 cm

Transcapsular spread:  Absent

Seminal vesicle involvement: Absent

Neurovascular bundle involvement: Absent

Pelvic adenopathy: Absent

Bone metastasis: Absent

Other findings: None
IMPRESSION: 1. Small indeterminate lesion at the border of the LEFT base / LEFT
transitional zone ( PI-RADS: 3). 3D post processing (Dynac[employer])
performed.
2. Enlarged nodular transitional zone most consistent benign
prostate hypertrophy (PI-RADS 1).

## 2019-11-10 DIAGNOSIS — I10 Essential (primary) hypertension: Secondary | ICD-10-CM | POA: Diagnosis not present

## 2019-11-10 DIAGNOSIS — Z125 Encounter for screening for malignant neoplasm of prostate: Secondary | ICD-10-CM | POA: Diagnosis not present

## 2019-11-17 DIAGNOSIS — D075 Carcinoma in situ of prostate: Secondary | ICD-10-CM | POA: Diagnosis not present

## 2019-11-17 DIAGNOSIS — Z0001 Encounter for general adult medical examination with abnormal findings: Secondary | ICD-10-CM | POA: Diagnosis not present

## 2019-11-17 DIAGNOSIS — N401 Enlarged prostate with lower urinary tract symptoms: Secondary | ICD-10-CM | POA: Diagnosis not present

## 2019-11-17 DIAGNOSIS — J309 Allergic rhinitis, unspecified: Secondary | ICD-10-CM | POA: Diagnosis not present

## 2020-08-01 DIAGNOSIS — Z20828 Contact with and (suspected) exposure to other viral communicable diseases: Secondary | ICD-10-CM | POA: Diagnosis not present

## 2020-08-01 DIAGNOSIS — R509 Fever, unspecified: Secondary | ICD-10-CM | POA: Diagnosis not present

## 2020-08-01 DIAGNOSIS — R0981 Nasal congestion: Secondary | ICD-10-CM | POA: Diagnosis not present

## 2020-08-01 DIAGNOSIS — R051 Acute cough: Secondary | ICD-10-CM | POA: Diagnosis not present

## 2020-11-21 DIAGNOSIS — I10 Essential (primary) hypertension: Secondary | ICD-10-CM | POA: Diagnosis not present

## 2020-11-21 DIAGNOSIS — Z0001 Encounter for general adult medical examination with abnormal findings: Secondary | ICD-10-CM | POA: Diagnosis not present

## 2020-11-21 DIAGNOSIS — Z125 Encounter for screening for malignant neoplasm of prostate: Secondary | ICD-10-CM | POA: Diagnosis not present

## 2020-11-21 DIAGNOSIS — J309 Allergic rhinitis, unspecified: Secondary | ICD-10-CM | POA: Diagnosis not present

## 2020-11-21 DIAGNOSIS — N401 Enlarged prostate with lower urinary tract symptoms: Secondary | ICD-10-CM | POA: Diagnosis not present

## 2020-12-06 ENCOUNTER — Other Ambulatory Visit: Payer: Self-pay | Admitting: Internal Medicine

## 2020-12-06 DIAGNOSIS — I10 Essential (primary) hypertension: Secondary | ICD-10-CM

## 2021-11-26 DIAGNOSIS — Z0000 Encounter for general adult medical examination without abnormal findings: Secondary | ICD-10-CM | POA: Diagnosis not present

## 2021-11-26 DIAGNOSIS — J309 Allergic rhinitis, unspecified: Secondary | ICD-10-CM | POA: Diagnosis not present

## 2021-11-26 DIAGNOSIS — I10 Essential (primary) hypertension: Secondary | ICD-10-CM | POA: Diagnosis not present

## 2021-12-10 DIAGNOSIS — Z1211 Encounter for screening for malignant neoplasm of colon: Secondary | ICD-10-CM | POA: Diagnosis not present

## 2021-12-10 DIAGNOSIS — Z1212 Encounter for screening for malignant neoplasm of rectum: Secondary | ICD-10-CM | POA: Diagnosis not present

## 2022-03-20 DIAGNOSIS — J020 Streptococcal pharyngitis: Secondary | ICD-10-CM | POA: Diagnosis not present

## 2022-11-26 DIAGNOSIS — I10 Essential (primary) hypertension: Secondary | ICD-10-CM | POA: Diagnosis not present

## 2022-11-29 ENCOUNTER — Other Ambulatory Visit (HOSPITAL_COMMUNITY): Payer: Self-pay | Admitting: Internal Medicine

## 2022-11-29 DIAGNOSIS — E7800 Pure hypercholesterolemia, unspecified: Secondary | ICD-10-CM

## 2022-11-29 DIAGNOSIS — D696 Thrombocytopenia, unspecified: Secondary | ICD-10-CM | POA: Diagnosis not present

## 2022-11-29 DIAGNOSIS — Z0000 Encounter for general adult medical examination without abnormal findings: Secondary | ICD-10-CM | POA: Diagnosis not present

## 2022-11-29 DIAGNOSIS — I8393 Asymptomatic varicose veins of bilateral lower extremities: Secondary | ICD-10-CM | POA: Diagnosis not present

## 2022-12-06 ENCOUNTER — Ambulatory Visit (HOSPITAL_COMMUNITY)
Admission: RE | Admit: 2022-12-06 | Discharge: 2022-12-06 | Disposition: A | Discharge status: Home / Self Care | Payer: Self-pay | Source: Ambulatory Visit | Attending: Internal Medicine | Admitting: Internal Medicine

## 2022-12-06 DIAGNOSIS — E7800 Pure hypercholesterolemia, unspecified: Secondary | ICD-10-CM | POA: Insufficient documentation

## 2023-01-10 DIAGNOSIS — I2510 Atherosclerotic heart disease of native coronary artery without angina pectoris: Secondary | ICD-10-CM | POA: Diagnosis not present

## 2023-11-26 DIAGNOSIS — I10 Essential (primary) hypertension: Secondary | ICD-10-CM | POA: Diagnosis not present

## 2023-11-28 DIAGNOSIS — R3912 Poor urinary stream: Secondary | ICD-10-CM | POA: Diagnosis not present

## 2023-11-28 DIAGNOSIS — R350 Frequency of micturition: Secondary | ICD-10-CM | POA: Diagnosis not present

## 2023-12-03 DIAGNOSIS — Z0000 Encounter for general adult medical examination without abnormal findings: Secondary | ICD-10-CM | POA: Diagnosis not present

## 2023-12-03 DIAGNOSIS — S90561A Insect bite (nonvenomous), right ankle, initial encounter: Secondary | ICD-10-CM | POA: Diagnosis not present

## 2023-12-03 DIAGNOSIS — I2510 Atherosclerotic heart disease of native coronary artery without angina pectoris: Secondary | ICD-10-CM | POA: Diagnosis not present

## 2023-12-23 DIAGNOSIS — R350 Frequency of micturition: Secondary | ICD-10-CM | POA: Diagnosis not present

## 2023-12-23 DIAGNOSIS — R3912 Poor urinary stream: Secondary | ICD-10-CM | POA: Diagnosis not present

## 2024-01-09 DIAGNOSIS — R3912 Poor urinary stream: Secondary | ICD-10-CM | POA: Diagnosis not present

## 2024-01-14 DIAGNOSIS — I2510 Atherosclerotic heart disease of native coronary artery without angina pectoris: Secondary | ICD-10-CM | POA: Diagnosis not present
